# Patient Record
Sex: Female | Born: 2000 | Race: Black or African American | Hispanic: No | Marital: Single | Smoking: Never smoker
Health system: Southern US, Community
[De-identification: ages and names within clinical notes are randomized; demographics above are authoritative.]

---
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
## 2020-10-26 ENCOUNTER — Ambulatory Visit: Payer: Self-pay
